# Patient Record
Sex: Male | Born: 1935
Health system: Southern US, Community
[De-identification: ages and names within clinical notes are randomized; demographics above are authoritative.]

## PROBLEM LIST (undated history)

## (undated) DIAGNOSIS — I10 Essential (primary) hypertension: Secondary | ICD-10-CM

## (undated) HISTORY — PX: CARDIAC SURGERY: SHX584

---

## 1998-10-12 ENCOUNTER — Emergency Department (HOSPITAL_COMMUNITY): Admission: EM | Admit: 1998-10-12 | Discharge: 1998-10-12 | Payer: Self-pay | Admitting: Internal Medicine

## 2001-05-10 ENCOUNTER — Other Ambulatory Visit: Admission: RE | Admit: 2001-05-10 | Discharge: 2001-05-10 | Payer: Self-pay | Admitting: Urology

## 2002-02-08 ENCOUNTER — Inpatient Hospital Stay (HOSPITAL_COMMUNITY): Admission: AD | Admit: 2002-02-08 | Discharge: 2002-02-12 | Payer: Self-pay | Admitting: Cardiology

## 2002-04-08 ENCOUNTER — Ambulatory Visit (HOSPITAL_COMMUNITY): Admission: RE | Admit: 2002-04-08 | Discharge: 2002-04-09 | Payer: Self-pay | Admitting: Internal Medicine

## 2003-09-17 ENCOUNTER — Ambulatory Visit (HOSPITAL_COMMUNITY): Admission: RE | Admit: 2003-09-17 | Discharge: 2003-09-17 | Payer: Self-pay | Admitting: Gastroenterology

## 2004-12-27 ENCOUNTER — Ambulatory Visit (HOSPITAL_COMMUNITY): Admission: RE | Admit: 2004-12-27 | Discharge: 2004-12-28 | Payer: Self-pay | Admitting: Cardiology

## 2014-07-11 ENCOUNTER — Ambulatory Visit: Payer: Self-pay | Admitting: Internal Medicine

## 2014-08-18 ENCOUNTER — Ambulatory Visit: Payer: Self-pay | Admitting: Internal Medicine

## 2015-06-08 ENCOUNTER — Other Ambulatory Visit: Payer: Self-pay | Admitting: Orthopedic Surgery

## 2015-10-20 ENCOUNTER — Emergency Department (HOSPITAL_COMMUNITY)
Admission: EM | Admit: 2015-10-20 | Discharge: 2015-10-20 | Disposition: A | Discharge status: Home / Self Care | Payer: Medicare Other | Source: Home / Self Care | Attending: Emergency Medicine | Admitting: Emergency Medicine

## 2015-10-20 ENCOUNTER — Encounter (HOSPITAL_COMMUNITY): Payer: Self-pay | Admitting: *Deleted

## 2015-10-20 DIAGNOSIS — I10 Essential (primary) hypertension: Secondary | ICD-10-CM

## 2015-10-20 HISTORY — DX: Essential (primary) hypertension: I10

## 2015-10-20 NOTE — ED Provider Notes (Signed)
CSN: [id number]     Arrival date & time 10/20/15  1843 History   First MD Initiated Contact with Patient 10/20/15 1954     No chief complaint on file.  (Consider location/radiation/quality/duration/timing/severity/associated sxs/prior Treatment) The history is provided by the patient. A language interpreter was used.   Took BP multiple times today and noted that it continued to rise. Took an additional lisinopril. Afraid he was going to have a stroke.  No past medical history on file. No past surgical history on file. No family history on file. Social History  Substance Use Topics  . Smoking status: Not on file  . Smokeless tobacco: Not on file  . Alcohol Use: Not on file    Review of Systems  Constitutional: Negative.   Respiratory: Negative.    High blood pressure Allergies  Review of patient's allergies indicates not on file.  Home Medications   Prior to Admission medications   Not on File   Meds Ordered and Administered this Visit  Medications - No data to display  BP 147/96 mmHg  Pulse 65  Temp(Src) 97.9 F (36.6 C) (Oral)  Resp 16  SpO2 96% No data found.   Physical Exam  Constitutional: He is oriented to person, place, and time. He appears well-developed and well-nourished.  HENT:  Head: Normocephalic and atraumatic.  Right Ear: External ear normal.  Left Ear: External ear normal.  Eyes: Conjunctivae are normal.  Neck: Normal range of motion. Neck supple.  Cardiovascular: Normal rate.   Pulmonary/Chest: Effort normal and breath sounds normal.  Abdominal: Soft.  Musculoskeletal: Normal range of motion.  Neurological: He is alert and oriented to person, place, and time.  Skin: Skin is warm and dry.  Psychiatric: He has a normal mood and affect. His behavior is normal. Judgment and thought content normal.    ED Course  Procedures (including critical care time)  Labs Review Labs Reviewed - No data to display  Imaging Review No results  found.   Visual Acuity Review  Right Eye Distance:   Left Eye Distance:   Bilateral Distance:    Right Eye Near:   Left Eye Near:    Bilateral Near:         MDM   1. Essential hypertension    No change in medications.  Symptomatic treatment Follow up with PCP tomorrow.    Konrad Felix, PA 10/20/15 [place], [place] 10/25/15 [phone]

## 2015-10-20 NOTE — ED Notes (Signed)
Pt  Reports     His  bp  Was  Elevated  Today  He   denys  Any  Symptoms      He      Is  Sitting  Upright  On the  Exam table  Speaking in  Complete  Symptoms

## 2015-10-20 NOTE — Discharge Instructions (Signed)
Your blood pressure is not dangerous at this time.   It is important to see your doctor for follow up and your doctor will adjust your medications as appropriate.   Take your blood pressure twice a day unless your doctor wants to record pressures more often.   IT IS IMPORTANT TO TAKE YOU MEDICINE AS YOUR DOCTOR DIRECTS

## 2017-11-30 DIAGNOSIS — I10 Essential (primary) hypertension: Secondary | ICD-10-CM | POA: Diagnosis not present

## 2017-12-04 DIAGNOSIS — I2510 Atherosclerotic heart disease of native coronary artery without angina pectoris: Secondary | ICD-10-CM | POA: Diagnosis not present

## 2017-12-04 DIAGNOSIS — I10 Essential (primary) hypertension: Secondary | ICD-10-CM | POA: Diagnosis not present

## 2017-12-04 DIAGNOSIS — N183 Chronic kidney disease, stage 3 (moderate): Secondary | ICD-10-CM | POA: Diagnosis not present

## 2018-06-26 DIAGNOSIS — N401 Enlarged prostate with lower urinary tract symptoms: Secondary | ICD-10-CM | POA: Diagnosis not present

## 2018-06-26 DIAGNOSIS — R3912 Poor urinary stream: Secondary | ICD-10-CM | POA: Diagnosis not present

## 2018-06-26 DIAGNOSIS — R972 Elevated prostate specific antigen [PSA]: Secondary | ICD-10-CM | POA: Diagnosis not present

## 2018-06-26 DIAGNOSIS — N5201 Erectile dysfunction due to arterial insufficiency: Secondary | ICD-10-CM | POA: Diagnosis not present

## 2018-06-28 DIAGNOSIS — I2510 Atherosclerotic heart disease of native coronary artery without angina pectoris: Secondary | ICD-10-CM | POA: Diagnosis not present

## 2018-06-28 DIAGNOSIS — R972 Elevated prostate specific antigen [PSA]: Secondary | ICD-10-CM | POA: Diagnosis not present

## 2018-06-28 DIAGNOSIS — N529 Male erectile dysfunction, unspecified: Secondary | ICD-10-CM | POA: Diagnosis not present

## 2018-06-28 DIAGNOSIS — E782 Mixed hyperlipidemia: Secondary | ICD-10-CM | POA: Diagnosis not present

## 2018-06-28 DIAGNOSIS — I10 Essential (primary) hypertension: Secondary | ICD-10-CM | POA: Diagnosis not present

## 2018-06-28 DIAGNOSIS — N400 Enlarged prostate without lower urinary tract symptoms: Secondary | ICD-10-CM | POA: Diagnosis not present

## 2018-06-28 DIAGNOSIS — N183 Chronic kidney disease, stage 3 (moderate): Secondary | ICD-10-CM | POA: Diagnosis not present

## 2018-06-28 DIAGNOSIS — Z0000 Encounter for general adult medical examination without abnormal findings: Secondary | ICD-10-CM | POA: Diagnosis not present

## 2018-09-20 DIAGNOSIS — Z23 Encounter for immunization: Secondary | ICD-10-CM | POA: Diagnosis not present

## 2019-03-07 DIAGNOSIS — E782 Mixed hyperlipidemia: Secondary | ICD-10-CM | POA: Diagnosis not present

## 2019-03-07 DIAGNOSIS — N183 Chronic kidney disease, stage 3 (moderate): Secondary | ICD-10-CM | POA: Diagnosis not present

## 2019-03-07 DIAGNOSIS — I2510 Atherosclerotic heart disease of native coronary artery without angina pectoris: Secondary | ICD-10-CM | POA: Diagnosis not present

## 2019-03-07 DIAGNOSIS — I10 Essential (primary) hypertension: Secondary | ICD-10-CM | POA: Diagnosis not present

## 2019-03-28 DIAGNOSIS — M65342 Trigger finger, left ring finger: Secondary | ICD-10-CM | POA: Diagnosis not present

## 2019-04-03 DIAGNOSIS — N183 Chronic kidney disease, stage 3 (moderate): Secondary | ICD-10-CM | POA: Diagnosis not present

## 2019-04-03 DIAGNOSIS — E782 Mixed hyperlipidemia: Secondary | ICD-10-CM | POA: Diagnosis not present

## 2019-04-03 DIAGNOSIS — M150 Primary generalized (osteo)arthritis: Secondary | ICD-10-CM | POA: Diagnosis not present

## 2019-04-03 DIAGNOSIS — I2510 Atherosclerotic heart disease of native coronary artery without angina pectoris: Secondary | ICD-10-CM | POA: Diagnosis not present

## 2019-04-03 DIAGNOSIS — I10 Essential (primary) hypertension: Secondary | ICD-10-CM | POA: Diagnosis not present

## 2019-04-04 ENCOUNTER — Other Ambulatory Visit (HOSPITAL_BASED_OUTPATIENT_CLINIC_OR_DEPARTMENT_OTHER): Payer: Self-pay | Admitting: Physician Assistant

## 2019-04-04 DIAGNOSIS — N183 Chronic kidney disease, stage 3 unspecified: Secondary | ICD-10-CM

## 2019-04-08 ENCOUNTER — Other Ambulatory Visit (HOSPITAL_BASED_OUTPATIENT_CLINIC_OR_DEPARTMENT_OTHER): Payer: Medicare Other

## 2019-04-09 ENCOUNTER — Ambulatory Visit (HOSPITAL_BASED_OUTPATIENT_CLINIC_OR_DEPARTMENT_OTHER): Payer: Medicare HMO

## 2019-04-10 ENCOUNTER — Other Ambulatory Visit: Payer: Self-pay

## 2019-04-10 ENCOUNTER — Ambulatory Visit (HOSPITAL_BASED_OUTPATIENT_CLINIC_OR_DEPARTMENT_OTHER)
Admission: RE | Admit: 2019-04-10 | Discharge: 2019-04-10 | Disposition: A | Discharge status: Home / Self Care | Payer: Medicare HMO | Source: Ambulatory Visit | Attending: Physician Assistant | Admitting: Physician Assistant

## 2019-04-10 DIAGNOSIS — N400 Enlarged prostate without lower urinary tract symptoms: Secondary | ICD-10-CM | POA: Diagnosis not present

## 2019-04-10 DIAGNOSIS — N183 Chronic kidney disease, stage 3 unspecified: Secondary | ICD-10-CM

## 2019-04-10 DIAGNOSIS — N281 Cyst of kidney, acquired: Secondary | ICD-10-CM | POA: Diagnosis not present

## 2019-05-09 DIAGNOSIS — M65342 Trigger finger, left ring finger: Secondary | ICD-10-CM | POA: Diagnosis not present

## 2019-05-10 DIAGNOSIS — H66002 Acute suppurative otitis media without spontaneous rupture of ear drum, left ear: Secondary | ICD-10-CM | POA: Diagnosis not present

## 2019-05-10 DIAGNOSIS — H6122 Impacted cerumen, left ear: Secondary | ICD-10-CM | POA: Diagnosis not present

## 2019-08-22 DIAGNOSIS — Z23 Encounter for immunization: Secondary | ICD-10-CM | POA: Diagnosis not present

## 2019-10-04 DIAGNOSIS — E782 Mixed hyperlipidemia: Secondary | ICD-10-CM | POA: Diagnosis not present

## 2019-10-04 DIAGNOSIS — I10 Essential (primary) hypertension: Secondary | ICD-10-CM | POA: Diagnosis not present

## 2019-10-04 DIAGNOSIS — N1831 Chronic kidney disease, stage 3a: Secondary | ICD-10-CM | POA: Diagnosis not present

## 2019-10-04 DIAGNOSIS — G629 Polyneuropathy, unspecified: Secondary | ICD-10-CM | POA: Diagnosis not present

## 2019-10-04 DIAGNOSIS — M150 Primary generalized (osteo)arthritis: Secondary | ICD-10-CM | POA: Diagnosis not present

## 2020-11-11 ENCOUNTER — Other Ambulatory Visit: Payer: Self-pay | Admitting: Nephrology

## 2020-11-11 DIAGNOSIS — N1832 Chronic kidney disease, stage 3b: Secondary | ICD-10-CM

## 2020-11-19 ENCOUNTER — Ambulatory Visit
Admission: RE | Admit: 2020-11-19 | Discharge: 2020-11-19 | Disposition: A | Discharge status: Home / Self Care | Payer: Medicare HMO | Source: Ambulatory Visit | Attending: Nephrology | Admitting: Nephrology

## 2020-11-19 DIAGNOSIS — N1832 Chronic kidney disease, stage 3b: Secondary | ICD-10-CM

## 2021-08-05 IMAGING — US US RENAL
1 series · 13 of 25 positions shown · non-contrast
Comparison: November 09, 2018

CLINICAL DATA: CKD

EXAM:
RENAL / URINARY TRACT ULTRASOUND COMPLETE

[Series 1: us renal · 0.26mm/px · 13 of 68 slices shown]
[im 1/68]
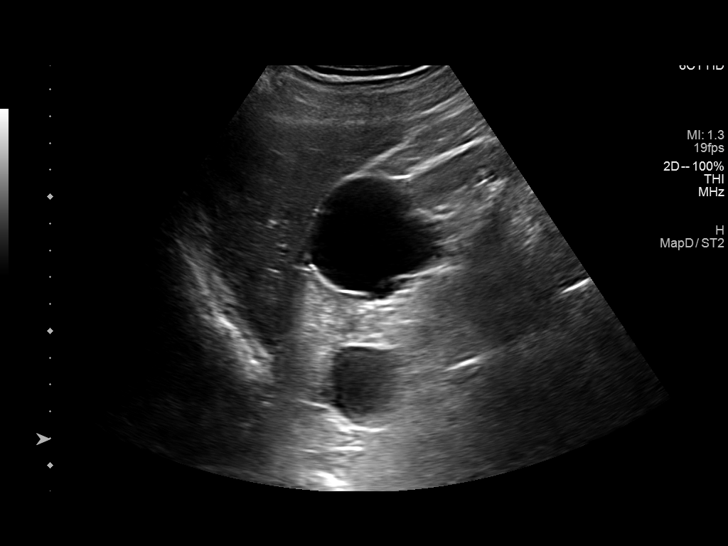
[im 6/68]
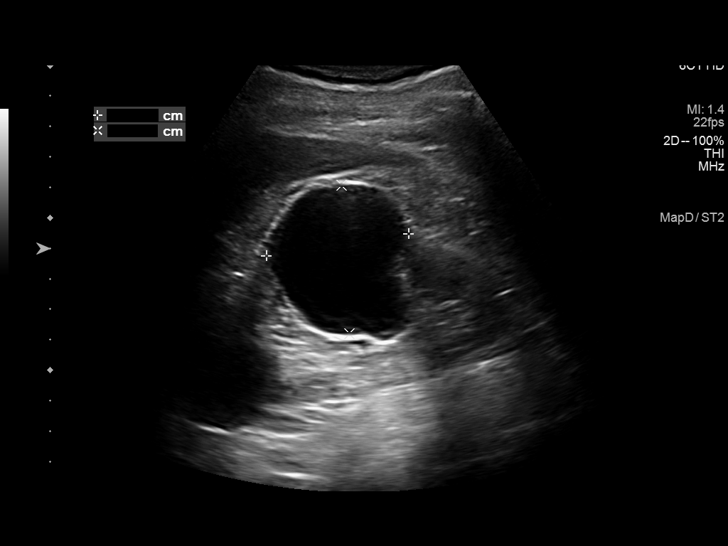
[im 12/68]
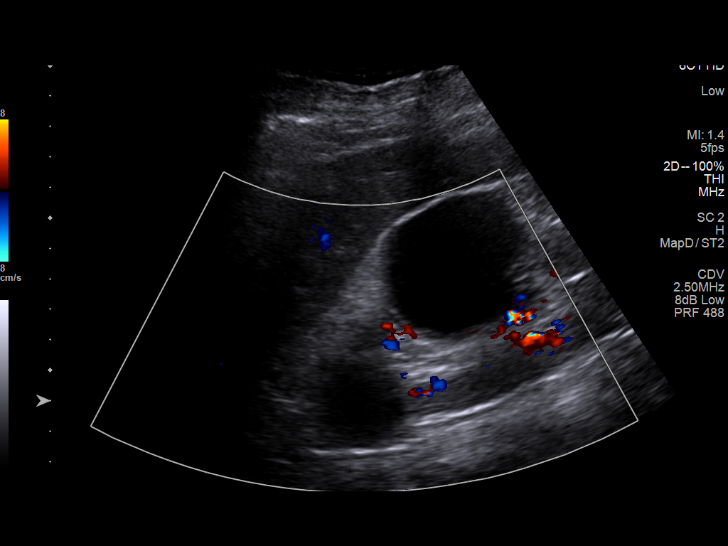
[im 17/68]
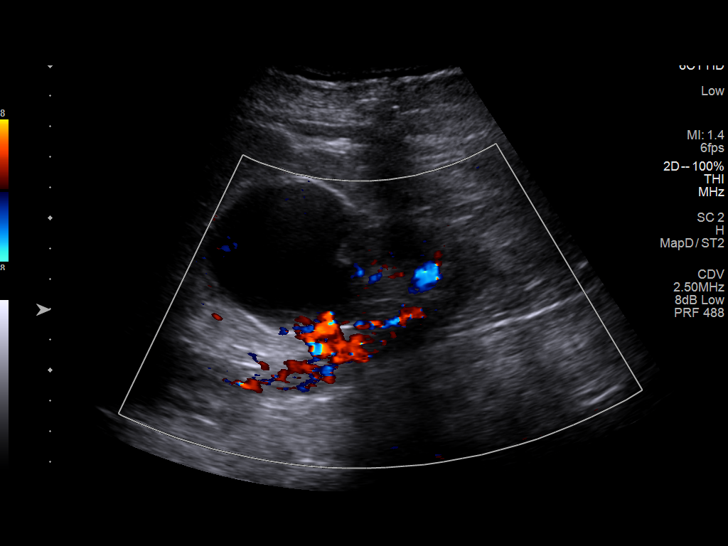
[im 23/68]
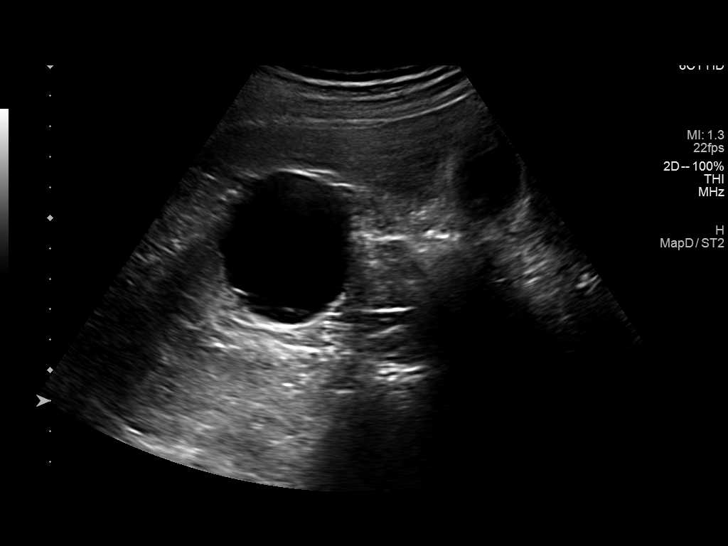
[im 28/68]
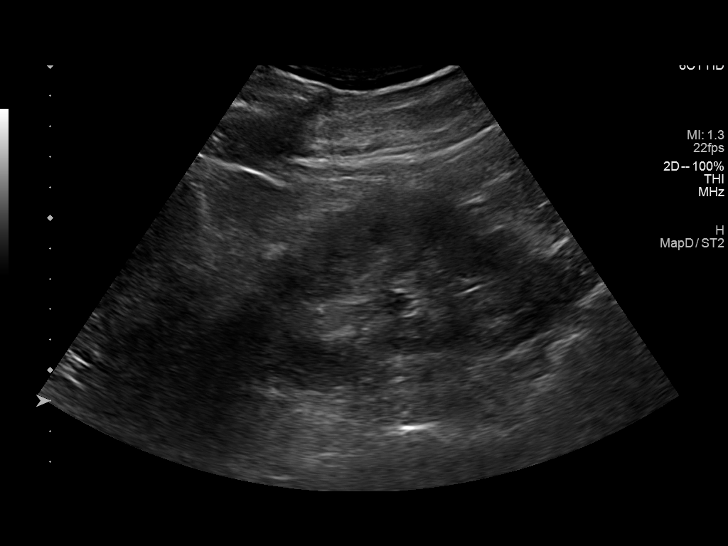
[im 34/68]
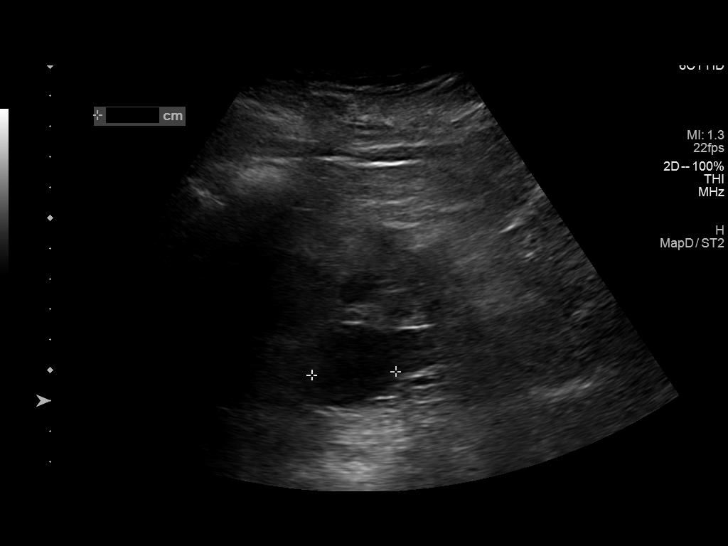
[im 40/68]
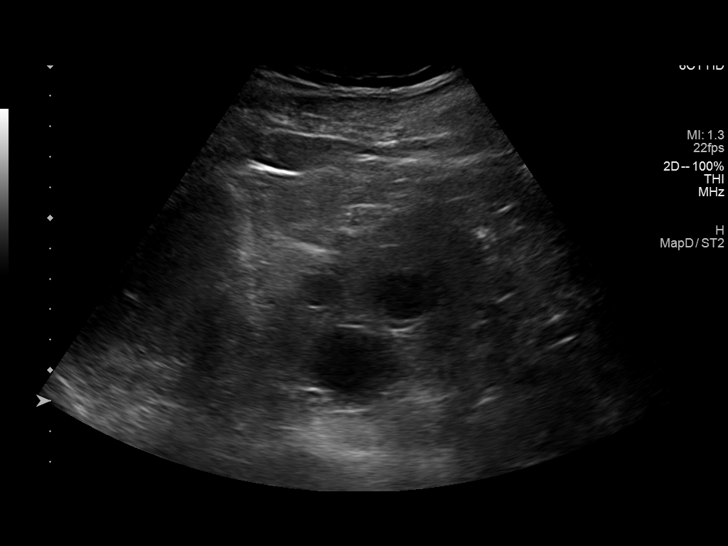
[im 45/68]
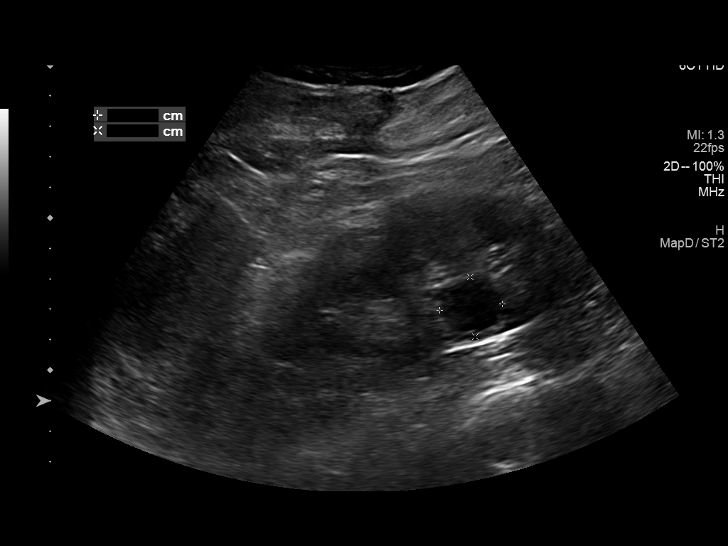
[im 51/68]
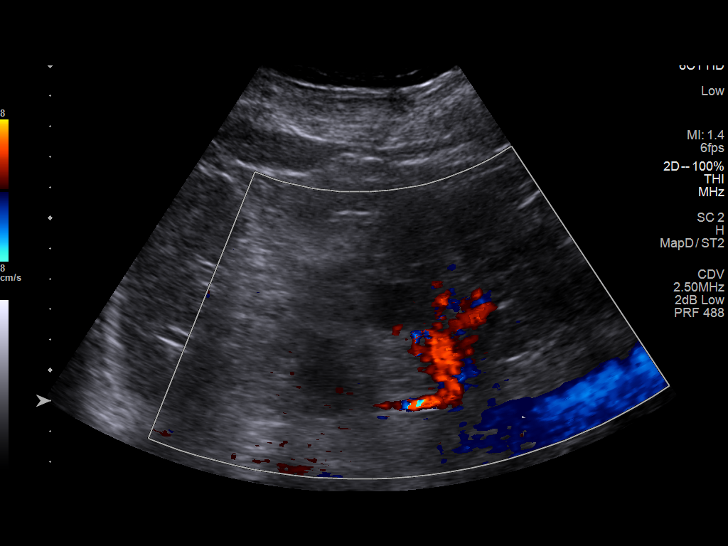
[im 56/68]
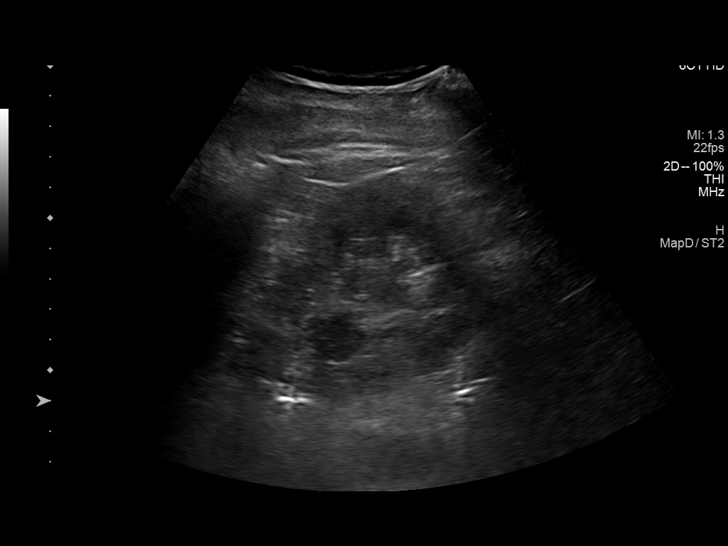
[im 62/68]
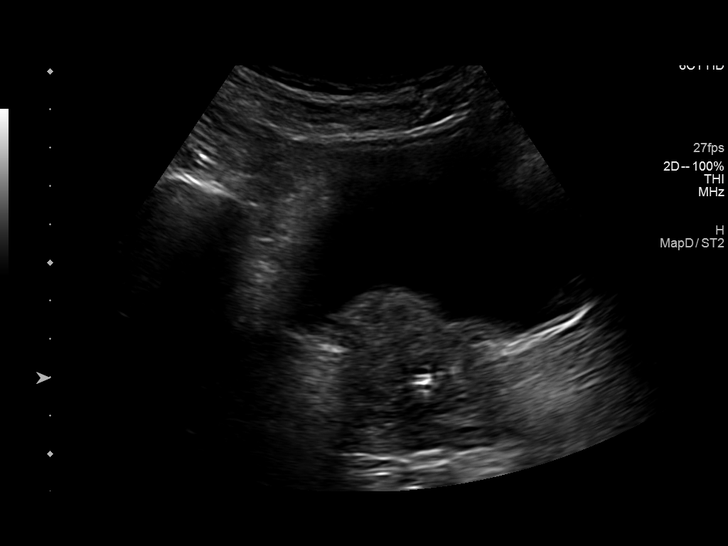
[im 68/68]
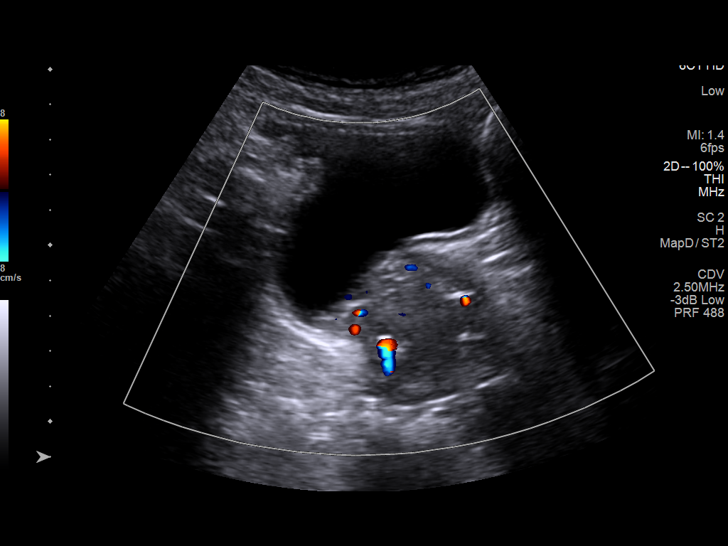

[13 of 25 positions shown; findings below may reference images not displayed]

FINDINGS: Right Kidney:

Renal measurements: 12.0 x 6.7 x 6.2 cm = volume: 261 mL. Mild
diffusely increased renal echogenicity, similar in comparison to
prior. There are multiple cysts within the RIGHT kidney. In the
interpolar kidney cough there is a 4.4 x 4.7 x 4.9 cm simple cyst.
In the superior pole of the kidney, there is a 3.1 x 3.1 x 3.2 cm
simple cyst. In the inferior pole, there is a hypoechoic mass which
measures 1.4 x 1.1 by 1.4 cm, unchanged and likely a complicated
cyst.

Left Kidney:

Renal measurements: 12.6 x 5.8 x 6.1 cm = volume: 231 mL. Mild
diffusely increased renal echogenicity, similar comparison to prior.
There are multiple cysts within the LEFT kidney. A simple cyst in
the superior pole measures 2.8 x 2.2 x 2.8 cm. An adjacent simple
cyst measures 1.6 x 1.2 x 1.5 cm. Additional simple cyst measures
1.9 x 1.7 x 1.5 cm. In the inferior kidney, there is a simple cyst
which measures 2.1 x 2.0 x 2.9 cm.

Bladder:

Trabeculated appearance of the bladder.

Other:

Prostatomegaly. The prostate measures 5.6 x 4.8 x 4.7 cm for a
volume of 66 ML.
IMPRESSION: 1. Diffusely increased renal echogenicity as can be seen in medical
renal disease.
2. Trabeculated bladder appearance with prostatomegaly likely
reflecting the sequela of chronic outlet obstruction.

## 2021-12-29 DIAGNOSIS — N1832 Chronic kidney disease, stage 3b: Secondary | ICD-10-CM | POA: Diagnosis not present

## 2021-12-30 DIAGNOSIS — E782 Mixed hyperlipidemia: Secondary | ICD-10-CM | POA: Diagnosis not present

## 2021-12-30 DIAGNOSIS — I10 Essential (primary) hypertension: Secondary | ICD-10-CM | POA: Diagnosis not present

## 2022-01-06 DIAGNOSIS — N400 Enlarged prostate without lower urinary tract symptoms: Secondary | ICD-10-CM | POA: Diagnosis not present

## 2022-01-06 DIAGNOSIS — N281 Cyst of kidney, acquired: Secondary | ICD-10-CM | POA: Diagnosis not present

## 2022-01-06 DIAGNOSIS — I471 Supraventricular tachycardia: Secondary | ICD-10-CM | POA: Diagnosis not present

## 2022-01-06 DIAGNOSIS — N1832 Chronic kidney disease, stage 3b: Secondary | ICD-10-CM | POA: Diagnosis not present

## 2022-01-06 DIAGNOSIS — I129 Hypertensive chronic kidney disease with stage 1 through stage 4 chronic kidney disease, or unspecified chronic kidney disease: Secondary | ICD-10-CM | POA: Diagnosis not present

## 2022-01-06 DIAGNOSIS — E875 Hyperkalemia: Secondary | ICD-10-CM | POA: Diagnosis not present

## 2022-01-06 DIAGNOSIS — Z9114 Patient's other noncompliance with medication regimen: Secondary | ICD-10-CM | POA: Diagnosis not present

## 2022-03-21 DIAGNOSIS — D1801 Hemangioma of skin and subcutaneous tissue: Secondary | ICD-10-CM | POA: Diagnosis not present

## 2022-03-21 DIAGNOSIS — L821 Other seborrheic keratosis: Secondary | ICD-10-CM | POA: Diagnosis not present

## 2022-03-21 DIAGNOSIS — L814 Other melanin hyperpigmentation: Secondary | ICD-10-CM | POA: Diagnosis not present

## 2022-03-21 DIAGNOSIS — L570 Actinic keratosis: Secondary | ICD-10-CM | POA: Diagnosis not present

## 2022-04-18 DIAGNOSIS — N1832 Chronic kidney disease, stage 3b: Secondary | ICD-10-CM | POA: Diagnosis not present

## 2022-04-27 DIAGNOSIS — M150 Primary generalized (osteo)arthritis: Secondary | ICD-10-CM | POA: Diagnosis not present

## 2022-04-27 DIAGNOSIS — N1832 Chronic kidney disease, stage 3b: Secondary | ICD-10-CM | POA: Diagnosis not present

## 2022-04-27 DIAGNOSIS — I10 Essential (primary) hypertension: Secondary | ICD-10-CM | POA: Diagnosis not present

## 2022-04-27 DIAGNOSIS — Z0000 Encounter for general adult medical examination without abnormal findings: Secondary | ICD-10-CM | POA: Diagnosis not present

## 2022-04-27 DIAGNOSIS — I2510 Atherosclerotic heart disease of native coronary artery without angina pectoris: Secondary | ICD-10-CM | POA: Diagnosis not present

## 2022-04-27 DIAGNOSIS — E782 Mixed hyperlipidemia: Secondary | ICD-10-CM | POA: Diagnosis not present

## 2022-04-27 DIAGNOSIS — G720 Drug-induced myopathy: Secondary | ICD-10-CM | POA: Diagnosis not present

## 2022-04-28 DIAGNOSIS — E782 Mixed hyperlipidemia: Secondary | ICD-10-CM | POA: Diagnosis not present

## 2022-04-28 DIAGNOSIS — G720 Drug-induced myopathy: Secondary | ICD-10-CM | POA: Diagnosis not present

## 2022-05-03 DIAGNOSIS — Z91148 Patient's other noncompliance with medication regimen for other reason: Secondary | ICD-10-CM | POA: Diagnosis not present

## 2022-05-03 DIAGNOSIS — N281 Cyst of kidney, acquired: Secondary | ICD-10-CM | POA: Diagnosis not present

## 2022-05-03 DIAGNOSIS — N1832 Chronic kidney disease, stage 3b: Secondary | ICD-10-CM | POA: Diagnosis not present

## 2022-05-03 DIAGNOSIS — I129 Hypertensive chronic kidney disease with stage 1 through stage 4 chronic kidney disease, or unspecified chronic kidney disease: Secondary | ICD-10-CM | POA: Diagnosis not present

## 2022-05-03 DIAGNOSIS — E875 Hyperkalemia: Secondary | ICD-10-CM | POA: Diagnosis not present

## 2022-05-03 DIAGNOSIS — I471 Supraventricular tachycardia: Secondary | ICD-10-CM | POA: Diagnosis not present

## 2022-05-03 DIAGNOSIS — N400 Enlarged prostate without lower urinary tract symptoms: Secondary | ICD-10-CM | POA: Diagnosis not present

## 2022-05-03 DIAGNOSIS — E872 Acidosis, unspecified: Secondary | ICD-10-CM | POA: Diagnosis not present

## 2022-08-10 DIAGNOSIS — Z23 Encounter for immunization: Secondary | ICD-10-CM | POA: Diagnosis not present

## 2022-08-24 DIAGNOSIS — N1832 Chronic kidney disease, stage 3b: Secondary | ICD-10-CM | POA: Diagnosis not present

## 2022-08-29 DIAGNOSIS — I129 Hypertensive chronic kidney disease with stage 1 through stage 4 chronic kidney disease, or unspecified chronic kidney disease: Secondary | ICD-10-CM | POA: Diagnosis not present

## 2022-08-29 DIAGNOSIS — E872 Acidosis, unspecified: Secondary | ICD-10-CM | POA: Diagnosis not present

## 2022-08-29 DIAGNOSIS — N281 Cyst of kidney, acquired: Secondary | ICD-10-CM | POA: Diagnosis not present

## 2022-08-29 DIAGNOSIS — N1832 Chronic kidney disease, stage 3b: Secondary | ICD-10-CM | POA: Diagnosis not present

## 2022-08-29 DIAGNOSIS — I471 Supraventricular tachycardia, unspecified: Secondary | ICD-10-CM | POA: Diagnosis not present

## 2022-08-29 DIAGNOSIS — E875 Hyperkalemia: Secondary | ICD-10-CM | POA: Diagnosis not present

## 2022-08-29 DIAGNOSIS — Z91148 Patient's other noncompliance with medication regimen for other reason: Secondary | ICD-10-CM | POA: Diagnosis not present

## 2022-08-29 DIAGNOSIS — N400 Enlarged prostate without lower urinary tract symptoms: Secondary | ICD-10-CM | POA: Diagnosis not present

## 2023-02-28 DIAGNOSIS — J40 Bronchitis, not specified as acute or chronic: Secondary | ICD-10-CM | POA: Diagnosis not present

## 2023-03-01 DIAGNOSIS — N1832 Chronic kidney disease, stage 3b: Secondary | ICD-10-CM | POA: Diagnosis not present

## 2023-03-06 DIAGNOSIS — E872 Acidosis, unspecified: Secondary | ICD-10-CM | POA: Diagnosis not present

## 2023-03-06 DIAGNOSIS — I471 Supraventricular tachycardia, unspecified: Secondary | ICD-10-CM | POA: Diagnosis not present

## 2023-03-06 DIAGNOSIS — N281 Cyst of kidney, acquired: Secondary | ICD-10-CM | POA: Diagnosis not present

## 2023-03-06 DIAGNOSIS — E875 Hyperkalemia: Secondary | ICD-10-CM | POA: Diagnosis not present

## 2023-03-06 DIAGNOSIS — N1832 Chronic kidney disease, stage 3b: Secondary | ICD-10-CM | POA: Diagnosis not present

## 2023-03-06 DIAGNOSIS — N400 Enlarged prostate without lower urinary tract symptoms: Secondary | ICD-10-CM | POA: Diagnosis not present

## 2023-03-06 DIAGNOSIS — Z91148 Patient's other noncompliance with medication regimen for other reason: Secondary | ICD-10-CM | POA: Diagnosis not present

## 2023-03-06 DIAGNOSIS — I129 Hypertensive chronic kidney disease with stage 1 through stage 4 chronic kidney disease, or unspecified chronic kidney disease: Secondary | ICD-10-CM | POA: Diagnosis not present

## 2023-04-17 DIAGNOSIS — S6991XA Unspecified injury of right wrist, hand and finger(s), initial encounter: Secondary | ICD-10-CM | POA: Diagnosis not present

## 2023-05-03 DIAGNOSIS — N400 Enlarged prostate without lower urinary tract symptoms: Secondary | ICD-10-CM | POA: Diagnosis not present

## 2023-05-03 DIAGNOSIS — Z79899 Other long term (current) drug therapy: Secondary | ICD-10-CM | POA: Diagnosis not present

## 2023-05-03 DIAGNOSIS — G720 Drug-induced myopathy: Secondary | ICD-10-CM | POA: Diagnosis not present

## 2023-05-03 DIAGNOSIS — I10 Essential (primary) hypertension: Secondary | ICD-10-CM | POA: Diagnosis not present

## 2023-05-03 DIAGNOSIS — Z9181 History of falling: Secondary | ICD-10-CM | POA: Diagnosis not present

## 2023-05-03 DIAGNOSIS — N1832 Chronic kidney disease, stage 3b: Secondary | ICD-10-CM | POA: Diagnosis not present

## 2023-05-03 DIAGNOSIS — E782 Mixed hyperlipidemia: Secondary | ICD-10-CM | POA: Diagnosis not present

## 2023-05-03 DIAGNOSIS — Z0000 Encounter for general adult medical examination without abnormal findings: Secondary | ICD-10-CM | POA: Diagnosis not present

## 2023-05-03 DIAGNOSIS — I2510 Atherosclerotic heart disease of native coronary artery without angina pectoris: Secondary | ICD-10-CM | POA: Diagnosis not present

## 2023-07-19 DIAGNOSIS — L820 Inflamed seborrheic keratosis: Secondary | ICD-10-CM | POA: Diagnosis not present

## 2023-07-19 DIAGNOSIS — L821 Other seborrheic keratosis: Secondary | ICD-10-CM | POA: Diagnosis not present

## 2023-07-19 DIAGNOSIS — D225 Melanocytic nevi of trunk: Secondary | ICD-10-CM | POA: Diagnosis not present

## 2023-07-19 DIAGNOSIS — D1801 Hemangioma of skin and subcutaneous tissue: Secondary | ICD-10-CM | POA: Diagnosis not present

## 2023-07-19 DIAGNOSIS — D2371 Other benign neoplasm of skin of right lower limb, including hip: Secondary | ICD-10-CM | POA: Diagnosis not present

## 2023-07-19 DIAGNOSIS — L570 Actinic keratosis: Secondary | ICD-10-CM | POA: Diagnosis not present

## 2023-07-31 ENCOUNTER — Ambulatory Visit: Payer: Medicare (Managed Care) | Admitting: Cardiovascular Disease

## 2023-08-01 ENCOUNTER — Ambulatory Visit: Payer: Medicare (Managed Care) | Admitting: Internal Medicine

## 2023-08-02 DIAGNOSIS — Z8249 Family history of ischemic heart disease and other diseases of the circulatory system: Secondary | ICD-10-CM | POA: Diagnosis not present

## 2023-08-02 DIAGNOSIS — M1990 Unspecified osteoarthritis, unspecified site: Secondary | ICD-10-CM | POA: Diagnosis not present

## 2023-08-02 DIAGNOSIS — N1832 Chronic kidney disease, stage 3b: Secondary | ICD-10-CM | POA: Diagnosis not present

## 2023-08-02 DIAGNOSIS — I2510 Atherosclerotic heart disease of native coronary artery without angina pectoris: Secondary | ICD-10-CM | POA: Diagnosis not present

## 2023-08-02 DIAGNOSIS — Z823 Family history of stroke: Secondary | ICD-10-CM | POA: Diagnosis not present

## 2023-08-02 DIAGNOSIS — K219 Gastro-esophageal reflux disease without esophagitis: Secondary | ICD-10-CM | POA: Diagnosis not present

## 2023-08-02 DIAGNOSIS — E785 Hyperlipidemia, unspecified: Secondary | ICD-10-CM | POA: Diagnosis not present

## 2023-08-02 DIAGNOSIS — G629 Polyneuropathy, unspecified: Secondary | ICD-10-CM | POA: Diagnosis not present

## 2023-08-02 DIAGNOSIS — I739 Peripheral vascular disease, unspecified: Secondary | ICD-10-CM | POA: Diagnosis not present

## 2023-08-02 DIAGNOSIS — Z809 Family history of malignant neoplasm, unspecified: Secondary | ICD-10-CM | POA: Diagnosis not present

## 2023-08-02 DIAGNOSIS — Z87891 Personal history of nicotine dependence: Secondary | ICD-10-CM | POA: Diagnosis not present

## 2023-08-02 DIAGNOSIS — I129 Hypertensive chronic kidney disease with stage 1 through stage 4 chronic kidney disease, or unspecified chronic kidney disease: Secondary | ICD-10-CM | POA: Diagnosis not present

## 2023-08-09 DIAGNOSIS — Z23 Encounter for immunization: Secondary | ICD-10-CM | POA: Diagnosis not present

## 2023-09-12 DIAGNOSIS — N1832 Chronic kidney disease, stage 3b: Secondary | ICD-10-CM | POA: Diagnosis not present

## 2023-09-19 DIAGNOSIS — Z91148 Patient's other noncompliance with medication regimen for other reason: Secondary | ICD-10-CM | POA: Diagnosis not present

## 2023-09-19 DIAGNOSIS — I471 Supraventricular tachycardia, unspecified: Secondary | ICD-10-CM | POA: Diagnosis not present

## 2023-09-19 DIAGNOSIS — R7989 Other specified abnormal findings of blood chemistry: Secondary | ICD-10-CM | POA: Diagnosis not present

## 2023-09-19 DIAGNOSIS — N281 Cyst of kidney, acquired: Secondary | ICD-10-CM | POA: Diagnosis not present

## 2023-09-19 DIAGNOSIS — I129 Hypertensive chronic kidney disease with stage 1 through stage 4 chronic kidney disease, or unspecified chronic kidney disease: Secondary | ICD-10-CM | POA: Diagnosis not present

## 2023-09-19 DIAGNOSIS — N400 Enlarged prostate without lower urinary tract symptoms: Secondary | ICD-10-CM | POA: Diagnosis not present

## 2023-09-19 DIAGNOSIS — E872 Acidosis, unspecified: Secondary | ICD-10-CM | POA: Diagnosis not present

## 2023-09-19 DIAGNOSIS — N1832 Chronic kidney disease, stage 3b: Secondary | ICD-10-CM | POA: Diagnosis not present

## 2023-09-19 DIAGNOSIS — E875 Hyperkalemia: Secondary | ICD-10-CM | POA: Diagnosis not present

## 2023-10-23 DIAGNOSIS — K080 Exfoliation of teeth due to systemic causes: Secondary | ICD-10-CM | POA: Diagnosis not present

## 2024-03-13 DIAGNOSIS — N1832 Chronic kidney disease, stage 3b: Secondary | ICD-10-CM | POA: Diagnosis not present

## 2024-03-20 DIAGNOSIS — I129 Hypertensive chronic kidney disease with stage 1 through stage 4 chronic kidney disease, or unspecified chronic kidney disease: Secondary | ICD-10-CM | POA: Diagnosis not present

## 2024-03-20 DIAGNOSIS — E875 Hyperkalemia: Secondary | ICD-10-CM | POA: Diagnosis not present

## 2024-03-20 DIAGNOSIS — N1832 Chronic kidney disease, stage 3b: Secondary | ICD-10-CM | POA: Diagnosis not present

## 2024-03-20 DIAGNOSIS — N281 Cyst of kidney, acquired: Secondary | ICD-10-CM | POA: Diagnosis not present

## 2024-05-07 DIAGNOSIS — Z1331 Encounter for screening for depression: Secondary | ICD-10-CM | POA: Diagnosis not present

## 2024-05-07 DIAGNOSIS — Z79899 Other long term (current) drug therapy: Secondary | ICD-10-CM | POA: Diagnosis not present

## 2024-05-07 DIAGNOSIS — Z0000 Encounter for general adult medical examination without abnormal findings: Secondary | ICD-10-CM | POA: Diagnosis not present

## 2024-05-07 DIAGNOSIS — I2510 Atherosclerotic heart disease of native coronary artery without angina pectoris: Secondary | ICD-10-CM | POA: Diagnosis not present

## 2024-05-07 DIAGNOSIS — E782 Mixed hyperlipidemia: Secondary | ICD-10-CM | POA: Diagnosis not present

## 2024-05-07 DIAGNOSIS — G720 Drug-induced myopathy: Secondary | ICD-10-CM | POA: Diagnosis not present

## 2024-05-07 DIAGNOSIS — I10 Essential (primary) hypertension: Secondary | ICD-10-CM | POA: Diagnosis not present

## 2024-05-27 DIAGNOSIS — L570 Actinic keratosis: Secondary | ICD-10-CM | POA: Diagnosis not present

## 2024-05-27 DIAGNOSIS — D485 Neoplasm of uncertain behavior of skin: Secondary | ICD-10-CM | POA: Diagnosis not present

## 2024-05-27 DIAGNOSIS — L821 Other seborrheic keratosis: Secondary | ICD-10-CM | POA: Diagnosis not present

## 2024-05-27 DIAGNOSIS — D1801 Hemangioma of skin and subcutaneous tissue: Secondary | ICD-10-CM | POA: Diagnosis not present

## 2024-05-27 DIAGNOSIS — D225 Melanocytic nevi of trunk: Secondary | ICD-10-CM | POA: Diagnosis not present

## 2024-05-27 DIAGNOSIS — D2371 Other benign neoplasm of skin of right lower limb, including hip: Secondary | ICD-10-CM | POA: Diagnosis not present

## 2024-05-27 DIAGNOSIS — L814 Other melanin hyperpigmentation: Secondary | ICD-10-CM | POA: Diagnosis not present

## 2024-06-07 DIAGNOSIS — Z6825 Body mass index (BMI) 25.0-25.9, adult: Secondary | ICD-10-CM | POA: Diagnosis not present

## 2024-06-07 DIAGNOSIS — S90861A Insect bite (nonvenomous), right foot, initial encounter: Secondary | ICD-10-CM | POA: Diagnosis not present

## 2024-06-07 DIAGNOSIS — T63421A Toxic effect of venom of ants, accidental (unintentional), initial encounter: Secondary | ICD-10-CM | POA: Diagnosis not present

## 2024-06-12 DIAGNOSIS — L988 Other specified disorders of the skin and subcutaneous tissue: Secondary | ICD-10-CM | POA: Diagnosis not present

## 2024-06-12 DIAGNOSIS — D485 Neoplasm of uncertain behavior of skin: Secondary | ICD-10-CM | POA: Diagnosis not present

## 2024-09-05 DIAGNOSIS — Z23 Encounter for immunization: Secondary | ICD-10-CM | POA: Diagnosis not present

## 2024-09-27 DIAGNOSIS — N1832 Chronic kidney disease, stage 3b: Secondary | ICD-10-CM | POA: Diagnosis not present

## 2024-10-01 DIAGNOSIS — E872 Acidosis, unspecified: Secondary | ICD-10-CM | POA: Diagnosis not present

## 2024-10-01 DIAGNOSIS — I129 Hypertensive chronic kidney disease with stage 1 through stage 4 chronic kidney disease, or unspecified chronic kidney disease: Secondary | ICD-10-CM | POA: Diagnosis not present

## 2024-10-01 DIAGNOSIS — N281 Cyst of kidney, acquired: Secondary | ICD-10-CM | POA: Diagnosis not present

## 2024-10-01 DIAGNOSIS — N1832 Chronic kidney disease, stage 3b: Secondary | ICD-10-CM | POA: Diagnosis not present
